# Patient Record
Sex: Female | Born: 1960 | Race: White | Hispanic: No | Marital: Married | Smoking: Former smoker
Health system: Southern US, Community
[De-identification: ages and names within clinical notes are randomized; demographics above are authoritative.]

## PROBLEM LIST (undated history)

## (undated) DIAGNOSIS — R238 Other skin changes: Secondary | ICD-10-CM

## (undated) DIAGNOSIS — I219 Acute myocardial infarction, unspecified: Secondary | ICD-10-CM

## (undated) DIAGNOSIS — E785 Hyperlipidemia, unspecified: Secondary | ICD-10-CM

## (undated) DIAGNOSIS — R609 Edema, unspecified: Secondary | ICD-10-CM

## (undated) DIAGNOSIS — K5792 Diverticulitis of intestine, part unspecified, without perforation or abscess without bleeding: Secondary | ICD-10-CM

## (undated) DIAGNOSIS — R233 Spontaneous ecchymoses: Secondary | ICD-10-CM

## (undated) DIAGNOSIS — I2510 Atherosclerotic heart disease of native coronary artery without angina pectoris: Secondary | ICD-10-CM

## (undated) HISTORY — PX: KNEE SURGERY: SHX244

## (undated) HISTORY — DX: Atherosclerotic heart disease of native coronary artery without angina pectoris: I25.10

## (undated) HISTORY — DX: Spontaneous ecchymoses: R23.3

## (undated) HISTORY — DX: Diverticulitis of intestine, part unspecified, without perforation or abscess without bleeding: K57.92

## (undated) HISTORY — DX: Edema, unspecified: R60.9

## (undated) HISTORY — DX: Acute myocardial infarction, unspecified: I21.9

## (undated) HISTORY — DX: Hyperlipidemia, unspecified: E78.5

## (undated) HISTORY — DX: Other skin changes: R23.8

## (undated) HISTORY — PX: ENDOMETRIAL ABLATION: SHX621

## (undated) HISTORY — PX: TONSILLECTOMY: SUR1361

---

## 1998-02-05 ENCOUNTER — Other Ambulatory Visit: Admission: RE | Admit: 1998-02-05 | Discharge: 1998-02-05 | Payer: Self-pay | Admitting: *Deleted

## 1999-02-09 ENCOUNTER — Other Ambulatory Visit: Admission: RE | Admit: 1999-02-09 | Discharge: 1999-02-09 | Payer: Self-pay | Admitting: *Deleted

## 1999-11-09 HISTORY — PX: TUBAL LIGATION: SHX77

## 2000-02-22 ENCOUNTER — Other Ambulatory Visit: Admission: RE | Admit: 2000-02-22 | Discharge: 2000-02-22 | Payer: Self-pay | Admitting: *Deleted

## 2000-05-13 ENCOUNTER — Encounter (INDEPENDENT_AMBULATORY_CARE_PROVIDER_SITE_OTHER): Payer: Self-pay | Admitting: Specialist

## 2000-05-13 ENCOUNTER — Other Ambulatory Visit: Admission: RE | Admit: 2000-05-13 | Discharge: 2000-05-13 | Payer: Self-pay | Admitting: *Deleted

## 2003-06-15 ENCOUNTER — Encounter: Payer: Self-pay | Admitting: Emergency Medicine

## 2003-06-15 ENCOUNTER — Emergency Department (HOSPITAL_COMMUNITY): Admission: EM | Admit: 2003-06-15 | Discharge: 2003-06-15 | Payer: Self-pay | Admitting: Emergency Medicine

## 2009-06-20 ENCOUNTER — Inpatient Hospital Stay (HOSPITAL_COMMUNITY): Admission: EM | Admit: 2009-06-20 | Discharge: 2009-06-24 | Payer: Self-pay | Admitting: Emergency Medicine

## 2009-06-20 DIAGNOSIS — I219 Acute myocardial infarction, unspecified: Secondary | ICD-10-CM

## 2009-06-20 HISTORY — DX: Acute myocardial infarction, unspecified: I21.9

## 2010-05-26 ENCOUNTER — Emergency Department (HOSPITAL_COMMUNITY): Admission: EM | Admit: 2010-05-26 | Discharge: 2010-05-26 | Payer: Self-pay | Admitting: Emergency Medicine

## 2010-09-07 ENCOUNTER — Ambulatory Visit (HOSPITAL_BASED_OUTPATIENT_CLINIC_OR_DEPARTMENT_OTHER): Admission: RE | Admit: 2010-09-07 | Discharge: 2010-09-07 | Payer: Self-pay | Admitting: Gynecology

## 2010-11-08 HISTORY — PX: CARDIAC CATHETERIZATION: SHX172

## 2011-01-20 LAB — POCT I-STAT 4, (NA,K, GLUC, HGB,HCT)
Glucose, Bld: 98 mg/dL (ref 70–99)
HCT: 41 % (ref 36.0–46.0)
Hemoglobin: 13.9 g/dL (ref 12.0–15.0)
Potassium: 4.4 mEq/L (ref 3.5–5.1)
Sodium: 142 mEq/L (ref 135–145)

## 2011-02-13 LAB — LIPID PANEL
Cholesterol: 179 mg/dL (ref 0–200)
HDL: 25 mg/dL — ABNORMAL LOW (ref >39)
LDL Cholesterol: 124 mg/dL — ABNORMAL HIGH (ref 0–99)
Total CHOL/HDL Ratio: 7.2 RATIO

## 2011-02-13 LAB — CBC
HCT: 36.1 % (ref 36.0–46.0)
HCT: 41.4 % (ref 36.0–46.0)
Hemoglobin: 14.1 g/dL (ref 12.0–15.0)
MCHC: 34 g/dL (ref 30.0–36.0)
MCV: 85.3 fL (ref 78.0–100.0)
Platelets: 235 10*3/uL (ref 150–400)
Platelets: 260 10*3/uL (ref 150–400)
RBC: 4.81 MIL/uL (ref 3.87–5.11)
RDW: 13.9 % (ref 11.5–15.5)
RDW: 14.2 % (ref 11.5–15.5)

## 2011-02-13 LAB — BASIC METABOLIC PANEL
BUN: 10 mg/dL (ref 6–23)
BUN: 10 mg/dL (ref 6–23)
CO2: 26 mEq/L (ref 19–32)
Creatinine, Ser: 0.77 mg/dL (ref 0.4–1.2)
GFR calc Af Amer: >60 mL/min (ref >60)
GFR calc non Af Amer: >60 mL/min (ref >60)
Sodium: 137 mEq/L (ref 135–145)

## 2011-02-14 LAB — DIFFERENTIAL
Basophils Absolute: 0.1 10*3/uL (ref 0.0–0.1)
Eosinophils Absolute: 0.2 10*3/uL (ref 0.0–0.7)
Eosinophils Relative: 2 % (ref 0–5)
Lymphocytes Relative: 26 % (ref 12–46)
Lymphs Abs: 2.7 10*3/uL (ref 0.7–4.0)
Monocytes Absolute: 0.7 10*3/uL (ref 0.1–1.0)

## 2011-02-14 LAB — BASIC METABOLIC PANEL
BUN: 10 mg/dL (ref 6–23)
BUN: 8 mg/dL (ref 6–23)
BUN: 9 mg/dL (ref 6–23)
CO2: 22 mEq/L (ref 19–32)
CO2: 24 mEq/L (ref 19–32)
Chloride: 103 mEq/L (ref 96–112)
Chloride: 108 mEq/L (ref 96–112)
Chloride: 108 mEq/L (ref 96–112)
GFR calc Af Amer: >60 mL/min (ref >60)
GFR calc non Af Amer: >60 mL/min (ref >60)
Glucose, Bld: 102 mg/dL — ABNORMAL HIGH (ref 70–99)
Glucose, Bld: 85 mg/dL (ref 70–99)
Glucose, Bld: 87 mg/dL (ref 70–99)
Potassium: 4 mEq/L (ref 3.5–5.1)
Potassium: 4 mEq/L (ref 3.5–5.1)
Potassium: 4.1 mEq/L (ref 3.5–5.1)

## 2011-02-14 LAB — CBC
HCT: 38.1 % (ref 36.0–46.0)
HCT: 39.7 % (ref 36.0–46.0)
HCT: 42 % (ref 36.0–46.0)
Hemoglobin: 14.5 g/dL (ref 12.0–15.0)
MCHC: 34 g/dL (ref 30.0–36.0)
MCHC: 34.6 g/dL (ref 30.0–36.0)
MCV: 84.6 fL (ref 78.0–100.0)
MCV: 85.1 fL (ref 78.0–100.0)
MCV: 85.2 fL (ref 78.0–100.0)
Platelets: 235 10*3/uL (ref 150–400)
Platelets: 236 10*3/uL (ref 150–400)
RDW: 14 % (ref 11.5–15.5)
RDW: 14.1 % (ref 11.5–15.5)
WBC: 9 10*3/uL (ref 4.0–10.5)

## 2011-02-14 LAB — TROPONIN I
Troponin I: 0.77 ng/mL (ref 0.00–0.06)
Troponin I: 1.11 ng/mL (ref 0.00–0.06)

## 2011-02-14 LAB — POCT CARDIAC MARKERS
CKMB, poc: 4.2 ng/mL (ref 1.0–8.0)
Troponin i, poc: 0.24 ng/mL — ABNORMAL HIGH (ref 0.00–0.09)

## 2011-02-14 LAB — CK TOTAL AND CKMB (NOT AT ARMC): Total CK: 155 U/L (ref 7–177)

## 2011-03-23 NOTE — Cardiovascular Report (Signed)
Sonya Mcdaniel, Sonya Mcdaniel              ACCOUNT NO.:  [id number]   MEDICAL RECORD NO.:  [id number]          PATIENT TYPE:  INP   LOCATION:  2918                         FACILITY:  [facility]   PHYSICIAN:  Corky Crafts, MDDATE OF BIRTH:  1961/02/22   DATE OF PROCEDURE:  DATE OF DISCHARGE:                            CARDIAC CATHETERIZATION   REFERRED BY:  [facility].   PROCEDURE PERFORMED:  Left heart catheterization, left ventriculogram,  coronary angiogram, abdominal aortogram, percutaneous coronary  intervention of the right coronary artery, aspiration, thrombectomy.   OPERATOR:  Corky Crafts, MD   INDICATIONS:  Non-ST segment elevation MI.   PROCEDURE NARRATIVE:  The risks and benefits of cardiac catheterization  were explained to the patient and she was brought urgently to the cath  lab.  She was prepped and draped in the usual sterile fashion.  A 5-  French sheath was placed into the right femoral artery using modified  Seldinger technique.  Left coronary artery angiography was performed  using a JL4.0 catheter.  The catheter was advanced to the vessel ostium  under fluoroscopic guidance.  Digital angiography was performed in  multiple projections using hand injection of contrast.  Right coronary  artery angiography was then performed using a JR4.0 catheter.  Catheter  was advanced to the vessel ostium under fluoroscopic guidance.  Digital  angiography was formed in a similar fashion.  Pigtail catheter was  advanced to the ascending aorta and across the aortic valve.  Under  fluoroscopic guidance, a power injection of contrast was performed in  the RAO projection to image the left ventricle.  The catheter was then  pulled back under continuous hemodynamic pressure monitoring.  The  catheter was withdrawn to the abdominal aorta and power injection of  contrast was performed in the AP projection.  The PCI was then  performed.  See below for details.   The sheath will be removed using  manual compression.   FINDINGS:  The left main was widely patent.   The left circumflex is a large vessel.  There are mild irregularities.  The OM-1 was a small vessel with moderate disease.  The OM-2 is a medium-  sized vessel which was widely patent.  The OM-3 had mild  atherosclerosis.  In between the OM-1 and OM-2, there appeared to be a  hazy 30 to 40% plaque.  There was normal flow through this vessel.   Left anterior descending was a large vessel with an 80% proximal to mid  lesion.  There is mild-to-moderate atherosclerosis in the remainder of  the vessel.  The first diagonal had sequential 80% lesions.   Right coronary artery was a moderate-sized vessel with diffuse  atherosclerosis.  In the mid vessel, there is a 99% lesion with visible  thrombus.  There was TIMI 2 flow through this lesion.  The  posterolateral and posterior descending arteries were both diffusely  diseased.  In the distal right coronary artery, there is a 70% lesion.   Left ventriculogram showed an inferobasal akinesis, otherwise normal  wall motion.  Ejection fraction was 55%.   HEMODYNAMICS:  Left ventricular pressure, 127/1 with an LVEDP of 12  mmHg, aortic pressure 130/75 with a mean aortic pressure of 99 mmHg.   The abdominal aortogram showed no abdominal aortic aneurysm.  There are  bilateral single renal arteries, both of which were widely patent.   PCI NARRATIVE:  A JR4 guiding catheter with side holes was used.  Prowater wire was placed across the lesion in the right coronary artery.  A Fetch catheter was passed to the lesion twice with minimal thrombus  recovered.  A 2.0 x 12 apex balloon was then inflated across the lesion  8 atmospheres for 21 seconds, but thrombus still appeared present.  The  Fetch was then put back for a third pass with successful aspiration of a  large thrombus.  A 2.5 x 12 MiniVision stent was then placed across the  lesion and  deployed at 15 atmospheres for 45 seconds.  The stent balloon  was then advanced to the distal RCA and inflated to 6 atmospheres for 27  seconds.  There was good flow, but there was a linear dissection through  that area.  A 2.5 x 12 Driver stent was then advanced to the distal RCA  and deployed at 12 atmospheres for 31 seconds.  A 2.75 x 8 Voyager Dundee  balloon was then placed within the distal stent inflated to 15  atmospheres for 36 seconds, it was moved proximally to the midvessel  stent and deployed at 16 atmospheres for 28 seconds.  There was TIMI 3  flow.  At the end of the procedure, there was no residual stenosis.   IMPRESSION:  1. Non- ST segment elevation myocardial infarction secondary to 99%      right coronary artery lesion, additional 70% distal right coronary      artery lesion as well, both treated with bare metal stent,      postdilated to 2.8 mm in diameter.  2. Left ventricular ejection fraction of 50 to 55% with inferior      hypokinesis.  3. Left anterior descending with an 80% lesion in the proximal and mid      vessel.  4. No abdominal aortic aneurysm.   RECOMMENDATIONS:  Continue aspirin and Plavix, start beta-blocker and  statin.  We will watch in the step-down unit tonight.  We will plan for  PCI of the LAD in a few days.      Corky Crafts, MD  Electronically Signed     JSV/MEDQ  D:  06/20/2009  T:  06/21/2009  Job:  [id number]

## 2011-03-23 NOTE — H&P (Signed)
[name], Sonya Mcdaniel              ACCOUNT NO.:  [id number]   MEDICAL RECORD NO.:  [id number]          PATIENT TYPE:  INP   LOCATION:  2918                         FACILITY:  [facility]   PHYSICIAN:  Corky Crafts, MDDATE OF BIRTH:  [birth year]   DATE OF ADMISSION:  06/20/2009  DATE OF DISCHARGE:                              HISTORY & PHYSICAL   REFERRING Sonya Mcdaniel:  [facility].   REASON FOR ADMISSION:  Unstable angina.   HISTORY OF PRESENT ILLNESS:  The patient is a 50 year old who has smoked  for a long time who has been having left arm pain, back pain, and chest  pain over the past week.  The longest episode of pain has been about 3  hours.  The pain was worse today so she went to her primary care  physician.  An ECG was done showing some inferior T-wave changes.  She  was sent to the emergency room for further evaluation.  Her initial  point of care cardiac markers were abnormal we are called to further  evaluate.   PAST MEDICAL HISTORY:  Diverticulitis.   PAST SURGICAL HISTORY:  Toe surgery.   ALLERGIES:  NO KNOWN DRUG ALLERGIES.   MEDICATIONS:  1. Ibuprofen p.r.n.  2. Tylenol p.r.n.   SOCIAL HISTORY:  She smokes a pack per day for about 30 years.  She  rarely drinks alcohol.  She does drink coffee every day.  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   FAMILY HISTORY:  No early coronary artery disease.   REVIEW OF SYSTEMS:  Significant for the chest pain as described above.  No dizziness, syncope, nausea, vomiting.  All other systems negative.   PHYSICAL EXAMINATION:  VITAL SIGNS:  Blood pressure 122/82, pulse 74.  GENERAL:  She is awake and alert, in no apparent distress.  NECK:  Right carotid bruit present.  CARDIOVASCULAR:  Regular rate and rhythm.  S1 and S2.  LUNGS:  Clear to auscultation bilaterally.  ABDOMEN:  Soft, nontender, nondistended.  EXTREMITIES:  Posterior tibial pulses 2+ bilaterally.   LABORATORY DATA:  Lab work  shows a troponin of 0.24, potassium 4.1,  troponin 0.7, hemoglobin 14.5.  ECG shows T-wave inversions in the  inferior lead with normal sinus rhythm on the initial ECG.  T-wave  inversion have improved on the more recent ECG, poor R-wave progression.   ASSESSMENT:  A 50 year old with unstable angina/acute coronary syndrome.   PLAN:  1. Risks and benefits of the cath were explained to the patient.  She      and her family understand.  We will proceed to catheterization.  2. She needs to stop smoking.  3. She will eventually need a beta-blocker and statin.  Further plans      depending on the results of the cath.      Corky Crafts, MD  Electronically Signed     JSV/MEDQ  D:  06/20/2009  T:  06/21/2009  Job:  [id number]

## 2011-03-23 NOTE — Cardiovascular Report (Signed)
NAMEYSIDRA, Sonya Mcdaniel              ACCOUNT NO.:  [id number]   MEDICAL RECORD NO.:  [id number]          PATIENT TYPE:  INP   LOCATION:  2506                         FACILITY:  [facility]   PHYSICIAN:  Corky Crafts, MDDATE OF BIRTH:  Mar 16, 1961   DATE OF PROCEDURE:  DATE OF DISCHARGE:                            CARDIAC CATHETERIZATION   REFERRING PHYSICIAN:  [facility].   PROCEDURES PERFORMED:  Percutaneous coronary intervention to the left  anterior descending, coronary angiogram, intracoronary vascular  ultrasound of the left anterior descending.   OPERATOR:  Corky Crafts, MD   INDICATIONS:  Unstable angina with recent myocardial infarction.   PROCEDURE NOTE:  The risks and benefits of cardiac catheterization were  explained to the patient and informed consent was obtained.  She was  brought to the cath lab.  She was prepped and draped in the usual  sterile fashion.  Her right groin was infiltrated with 1% lidocaine.  A  6-French sheath was placed into the right femoral artery using the  modified Seldinger technique.  Right coronary artery angiography was  performed using a 4-French JR-4 catheter.  The catheter was advanced to  the vessel ostium under fluoroscopic guidance.  Digital angiography was  performed using hand injection of contrast.  A CLS 3.0 guiding catheter  was then advanced to the ostium of the left main under fluoroscopic  guidance.  There is catheter damping noted.  The catheter was then  switched out for a XB 3.5 with side holes.  The lesion in the LAD was  then intervened upon.  See below for details.  The sheath was removed,  then an Angio-Seal was deployed for hemostasis.  Angiomax was used  throughout the case for anticoagulation.  Intracoronary nitroglycerin  was given several times during the case.  Interventional narrative XB  3.5 with side holes was used.  A Prowater wire was placed across the  lesion in the LAD.  A 2.5 x  12 apex was placed across the lesion and  inflated to 8 atmospheres for 30 seconds.  A 3.0 x 15 Endeavor stent was  then deployed across the lesion at 14 atmospheres.  The stent was then  postdilated with a 3.5 x 12 mm Voyager Cowgill balloon, inflated in the  distal portion to 16 atmospheres, and in the more proximal portion to 15  atmospheres and IVUS of the LAD stent was then performed, which showed a  well-opposed stent.  There is no residual stenosis.  There is TIMI III  flow.   IMPRESSION:  1. Patent right coronary artery stents, which were placed previously.  2. Successful drug-eluting stent placement to the left anterior      descending.  3. The circumflex was also reevaluated and did not have a significant      stenosis.  The hazy area which was seen on the prior angiogram      appeared to be improved when imaged with the guiding catheter.  4. There was still residual disease in a small diagonal vessel, but      this appeared to be too small even  to stent.   RECOMMENDATIONS:  Continue aspirin and Plavix indefinitely along with  secondary prevention including smoking cessation.      Corky Crafts, MD  Electronically Signed     JSV/MEDQ  D:  06/23/2009  T:  06/24/2009  Job:  [id number]

## 2011-03-23 NOTE — Discharge Summary (Signed)
Sonya Mcdaniel, Sonya Mcdaniel              ACCOUNT NO.:  [id number]   MEDICAL RECORD NO.:  [id number]          PATIENT TYPE:  INP   LOCATION:  2506                         FACILITY:  [facility]   PHYSICIAN:  Corky Crafts, MDDATE OF BIRTH:  1961-10-16   DATE OF ADMISSION:  06/20/2009  DATE OF DISCHARGE:  06/24/2009                               DISCHARGE SUMMARY   DISCHARGE DIAGNOSES:  1. Myocardial infarction.  2. Coronary artery disease.  3. Dyslipidemia.  4. Tobacco abuse.   PROCEDURE PERFORMED:  1. On June 20, 2009, cardiac catheterization with right coronary      artery stent placement x2 with bare-metal stent.  2. On June 23, 2009, cardiac catheterization with drug-eluting stent      to the left anterior descending.   HOSPITAL COURSE:  The patient was admitted on June 20, 2009, with  unstable angina and non-ST elevation MI.  She was taken to the cath lab  and had aspiration thrombectomy and revascularization of her right  coronary artery.  There was also LAD disease noted, which was fixed on  Monday June 23, 2009.  She tolerated both procedures very well.  She  did not have any groin issues.  She did not have any significant chest  pain.  She was able to refrain from any tobacco while in the hospital.  She did have a low HDL of 25 and high LDL of 308.  Triglycerides were  150 and total cholesterol 179.   DISCHARGE MEDICATIONS:  1. Aspirin 325 mg a day.  2. Plavix 75 mg daily.  3. Metoprolol extended release 25 mg daily.  4. Simvastatin 40 mg daily.   ACE inhibitor was not used because of borderline blood pressures.   DISCHARGE INSTRUCTIONS:  She is instructed not to lift more than 10  pounds for about a week.  She is not to drive for a few days.  She is  instructed to stop smoking.  Low salt, heart healthy diet.   FOLLOWUP:  Follow up with Dr. Eldridge Dace on July 02, 2009, at 2:30 p.m.      Corky Crafts, MD  Electronically Signed     JSV/MEDQ  D:   06/24/2009  T:  06/24/2009  Job:  [id number]

## 2013-01-05 ENCOUNTER — Other Ambulatory Visit: Payer: Self-pay | Admitting: Family Medicine

## 2013-01-05 DIAGNOSIS — Z1231 Encounter for screening mammogram for malignant neoplasm of breast: Secondary | ICD-10-CM

## 2013-01-29 ENCOUNTER — Ambulatory Visit
Admission: RE | Admit: 2013-01-29 | Discharge: 2013-01-29 | Disposition: A | Discharge status: Home / Self Care | Payer: No Typology Code available for payment source | Source: Ambulatory Visit | Attending: Family Medicine | Admitting: Family Medicine

## 2013-01-29 DIAGNOSIS — Z1231 Encounter for screening mammogram for malignant neoplasm of breast: Secondary | ICD-10-CM

## 2013-07-20 HISTORY — PX: HAMMER TOE SURGERY: SHX385

## 2013-08-16 ENCOUNTER — Encounter: Payer: Self-pay | Admitting: Podiatry

## 2013-08-16 ENCOUNTER — Ambulatory Visit (INDEPENDENT_AMBULATORY_CARE_PROVIDER_SITE_OTHER): Payer: No Typology Code available for payment source | Admitting: Podiatry

## 2013-08-16 ENCOUNTER — Ambulatory Visit (INDEPENDENT_AMBULATORY_CARE_PROVIDER_SITE_OTHER): Payer: No Typology Code available for payment source

## 2013-08-16 VITALS — BP 137/88 | HR 77 | Resp 16 | Ht 67.0 in | Wt 160.0 lb

## 2013-08-16 DIAGNOSIS — Z9889 Other specified postprocedural states: Secondary | ICD-10-CM

## 2013-08-16 NOTE — Progress Notes (Signed)
  Subjective:    Patient ID: Sonya Mcdaniel, female    DOB: 1961-10-16, 52 y.o.   MRN: [id number]  HPI    Review of Systems     Objective:   Physical Exam        Assessment & Plan:

## 2013-08-16 NOTE — Patient Instructions (Signed)
Continue wrapping toes.  May wear shoes.

## 2013-08-16 NOTE — Progress Notes (Signed)
Subjective:     Patient ID: Sonya Mcdaniel, female   DOB: 11-11-1960, 52 y.o.   MRN: [id number]  HPI French Ana presents today for followup of bilateral surgical foot date of surgery was 07/20/2013. She states that they're doing quite well.  Review of Systems Unremarkable    Objective:   Physical Exam Mildly edematous toes 2 through 4 bilateral. Otherwise appear to be healing quite nicely.    Assessment:     Well-healing surgical foot bilateral    Plan:     Followup with me in one month.

## 2013-09-18 ENCOUNTER — Encounter: Payer: No Typology Code available for payment source | Admitting: Podiatry

## 2013-09-20 ENCOUNTER — Encounter: Payer: No Typology Code available for payment source | Admitting: Podiatry

## 2013-10-02 ENCOUNTER — Encounter: Payer: Self-pay | Admitting: Podiatry

## 2013-10-02 ENCOUNTER — Ambulatory Visit (INDEPENDENT_AMBULATORY_CARE_PROVIDER_SITE_OTHER): Payer: No Typology Code available for payment source | Admitting: Podiatry

## 2013-10-02 ENCOUNTER — Ambulatory Visit (INDEPENDENT_AMBULATORY_CARE_PROVIDER_SITE_OTHER): Payer: No Typology Code available for payment source

## 2013-10-02 VITALS — BP 140/83 | HR 78 | Resp 16

## 2013-10-02 DIAGNOSIS — Z9889 Other specified postprocedural states: Secondary | ICD-10-CM

## 2013-10-02 NOTE — Progress Notes (Signed)
Sonya Mcdaniel presents today for her final postop visit regarding hammertoe repair lesser digits bilateral foot. She had this procedure performed in September. She denies fever chills nausea vomiting muscle aches or pains. And is back to her regular shoes.  Objective: Vital signs are stable she is alert and oriented x3. Pulses are strongly palpable bilateral. Much decrease in edema no erythema saline is drainage or odor to the toes. Radiographic evaluation Mr. is well-healed surgical toes bilateral.  Assessment: Well-healing surgical toes bilateral.  Plan: Get back to her regular routine will followup with me only on an as-needed basis.

## 2013-10-23 ENCOUNTER — Ambulatory Visit: Payer: No Typology Code available for payment source | Admitting: Interventional Cardiology

## 2013-11-27 ENCOUNTER — Encounter: Payer: Self-pay | Admitting: Cardiology

## 2013-11-27 DIAGNOSIS — I252 Old myocardial infarction: Secondary | ICD-10-CM

## 2013-11-27 DIAGNOSIS — I2510 Atherosclerotic heart disease of native coronary artery without angina pectoris: Secondary | ICD-10-CM

## 2013-11-27 DIAGNOSIS — E782 Mixed hyperlipidemia: Secondary | ICD-10-CM | POA: Insufficient documentation

## 2013-11-29 ENCOUNTER — Ambulatory Visit (INDEPENDENT_AMBULATORY_CARE_PROVIDER_SITE_OTHER): Payer: BC Managed Care – PPO | Admitting: Interventional Cardiology

## 2013-11-29 ENCOUNTER — Encounter: Payer: Self-pay | Admitting: Interventional Cardiology

## 2013-11-29 VITALS — BP 124/76 | HR 72 | Ht 67.0 in | Wt 173.0 lb

## 2013-11-29 DIAGNOSIS — I252 Old myocardial infarction: Secondary | ICD-10-CM

## 2013-11-29 DIAGNOSIS — E782 Mixed hyperlipidemia: Secondary | ICD-10-CM

## 2013-11-29 DIAGNOSIS — I2510 Atherosclerotic heart disease of native coronary artery without angina pectoris: Secondary | ICD-10-CM

## 2013-11-29 MED ORDER — CLOPIDOGREL BISULFATE 75 MG PO TABS: 75.0000 mg | ORAL_TABLET | Freq: Every day | ORAL | Status: DC

## 2013-11-29 MED ORDER — METOPROLOL SUCCINATE ER 25 MG PO TB24: 25.0000 mg | ORAL_TABLET | Freq: Every day | ORAL | Status: DC

## 2013-11-29 MED ORDER — ATORVASTATIN CALCIUM 40 MG PO TABS: 40.0000 mg | ORAL_TABLET | Freq: Every day | ORAL | Status: DC

## 2013-11-29 NOTE — Patient Instructions (Signed)
Your physician wants you to follow-up in: 1 year with Dr. Eldridge DaceVaranasi. You will receive a reminder letter in the mail two months in advance. If you don't receive a letter, please call our office to schedule the follow-up appointment.  Your physician recommends that you return for a FASTING lipid profile: 12/05/13.

## 2013-11-29 NOTE — Progress Notes (Signed)
Patient ID: Sonya Mcdaniel, female   DOB: 10/07/1961, 53 y.o.   MRN: [id number]    [address] [place], [place]  [address] Phone: [phone] Fax:  [phone]  Date:  11/29/2013   ID:  [name], DOB 11/17/1960, MRN [id number]  PCP:  Joycelyn Rua, MD      History of Present Illness: Sonya Mcdaniel is a 53 y.o. female with CAD. She has a BMS in RCA and DES in LAD. Had uterine procedure to help with bleeding. Mother had a stent recently. Daughter was diagnosed with Hodgkins disease. Nephew was diagnosed with the same. CAD/ASCVD:  She has been exercising regularly several times a week at home. SHe does Zumba. No chest pain with that activity. Denies : Chest pain.  Dizziness.  Dyspnea on exertion.   She had foot surgery.    Wt Readings from Last 3 Encounters:  11/29/13 173 lb (78.472 kg)  08/16/13 160 lb (72.576 kg)     Past Medical History  Diagnosis Date  . Swelling     FEET  . Heart attack 08.13.2010  . Bruises easily   . Hyperlipidemia   . Coronary artery disease   . Diverticulitis     Current Outpatient Prescriptions  Medication Sig Dispense Refill  . aspirin 325 MG tablet Take 325 mg by mouth daily.      Marland Kitchen atorvastatin (LIPITOR) 40 MG tablet Take 40 mg by mouth daily.      . Cetirizine HCl (ZYRTEC ALLERGY PO) Take by mouth daily.      . clopidogrel (PLAVIX) 75 MG tablet Take 75 mg by mouth daily.      . Magnesium 250 MG TABS Take 1,000 mg by mouth daily.      . metoprolol succinate (TOPROL-XL) 25 MG 24 hr tablet Take 25 mg by mouth daily.      . niacin (NIASPAN) 500 MG CR tablet Take 1,000 mg by mouth at bedtime.       . nitroGLYCERIN (NITROSTAT) 0.4 MG SL tablet Place 0.4 mg under the tongue every 5 (five) minutes as needed for chest pain.       No current facility-administered medications for this visit.    Allergies:    Allergies  Allergen Reactions  . Codeine Other (See Comments)    SINUS   . Tramadol Nausea And Vomiting     Social History:  The patient  reports that she has quit smoking. Her smoking use included Cigarettes. She smoked 0.25 packs per day. She has never used smokeless tobacco. She reports that she drinks alcohol. She reports that she does not use illicit drugs.   Family History:  The patient's family history is not on file.   ROS:  Please see the history of present illness.  No nausea, vomiting.  No fevers, chills.  No focal weakness.  No dysuria. Flushing with Niaspan.   All other systems reviewed and negative.   PHYSICAL EXAM: VS:  BP 124/76  Pulse 72  Ht 5\' 7"  (1.702 m)  Wt 173 lb (78.472 kg)  BMI 27.09 kg/m2 Well nourished, well developed, in no acute distress HEENT: normal Neck: no JVD, no carotid bruits Cardiac:  normal S1, S2; RRR;  Lungs:  clear to auscultation bilaterally, no wheezing, rhonchi or rales Abd: soft, nontender, no hepatomegaly Ext: no edema Skin: warm and dry Neuro:   no focal abnormalities noted  EKG:  NSR, no St segment changes     ASSESSMENT AND PLAN:  Coronary atherosclerosis  of native coronary artery  Refill Plavix Tablet, 75 MG, 1 tablet, Orally, Once a day, 90 days, 90, Refills 3 Refill Metoprolol Succinate Tablet Extended Release 24 Hour, 25 MG, 1 tablet, Orally, Once a day, 90 days, 90, Refills 3 Decrease Aspirin 325 mg tablet, 325 mg, half tab, orally, daily Refill Nitroglycerin 0.4 mg tablet, 0.4 mg, 1 tablet as directed, SL, as directed prn chest pain, 30 days, 25, Refills 3 Diagnostic Imaging:EKG Harward,Amy 10/24/2012 04:03:16 PM > Karinne Schmader,JAY 10/24/2012 05:56:41 PM > normal  No angina. OK to decrease aspirin dose if bleeding comes back.    2. History of MI (myocardial infarction)  No CHF.    3. Combined hyperlipidemia  Continue Atorvastatin Calcium Tablet, 40 MG, 1 tablet, Orally, Once a day, 90, Refills 3 LDL controlled. HDL low. Lipids reviewed from 08/2012.   Now off of Niaspan.  We'll see how lipids are doing. If they're well  controlled, can staff Niaspan. If HDL still low and triglycerides have increased, would have to come up with another therapy. She stopped the Niaspan due to Flushing. Preventive Medicine  Adult topics discussed:  Exercise: Continue present exercise program.      Signed, Fredric MareJay S. Tenelle Andreason, MD, [facility] 11/29/2013 4:41 PM

## 2013-12-02 ENCOUNTER — Other Ambulatory Visit: Payer: Self-pay | Admitting: Interventional Cardiology

## 2013-12-03 ENCOUNTER — Other Ambulatory Visit: Payer: Self-pay | Admitting: Interventional Cardiology

## 2013-12-05 ENCOUNTER — Other Ambulatory Visit (INDEPENDENT_AMBULATORY_CARE_PROVIDER_SITE_OTHER): Payer: BC Managed Care – PPO

## 2013-12-05 DIAGNOSIS — E782 Mixed hyperlipidemia: Secondary | ICD-10-CM

## 2013-12-05 LAB — LIPID PANEL
CHOL/HDL RATIO: 4
CHOLESTEROL: 109 mg/dL (ref 0–200)
HDL: 31.1 mg/dL — ABNORMAL LOW (ref >39.00)
LDL Cholesterol: 45 mg/dL (ref 0–99)
TRIGLYCERIDES: 166 mg/dL — AB (ref 0.0–149.0)
VLDL: 33.2 mg/dL (ref 0.0–40.0)

## 2013-12-05 LAB — ALT: ALT: 22 U/L (ref 0–35)

## 2013-12-05 NOTE — Progress Notes (Signed)
Quick Note:  Preliminary report reviewed by triage nurse and sent to MD desk. ______ 

## 2013-12-07 ENCOUNTER — Other Ambulatory Visit: Payer: Self-pay | Admitting: Interventional Cardiology

## 2013-12-14 ENCOUNTER — Telehealth: Payer: Self-pay | Admitting: Cardiology

## 2013-12-14 DIAGNOSIS — E782 Mixed hyperlipidemia: Secondary | ICD-10-CM

## 2013-12-14 NOTE — Telephone Encounter (Signed)
Message copied by Theda SersSTEGALL, Kyrstyn Greear H on Fri Dec 14, 2013  3:53 PM ------      Message from: SMART, Gaspar SkeetersJEREMY G      Created: Mon Dec 10, 2013 10:44 AM       LDL goal < 70, non-HDL goal < 100      Meds:  Lipitor 40 mg qd.      Patient stopped niaspan 1000 mg on her own since last panel.      LDL and non-HDL both at goal despite chronic low HDL.      ALT normal.      Plan:      1.  No change in meds at this time.      2.  Will check an NMR in 6 months to assess LDL-P given h/o low HDL and CAD at young age.  If LDL-P elevated, may consider titrating lipitor to 80 mg at that time.      3.  Check an NMR LipoProfile and Hepatic panel in 6 months.      Please notify patient, and set up labs. Thanks. ------

## 2013-12-14 NOTE — Telephone Encounter (Signed)
Pt notified and labs ordered.

## 2014-05-24 ENCOUNTER — Other Ambulatory Visit (INDEPENDENT_AMBULATORY_CARE_PROVIDER_SITE_OTHER): Payer: BC Managed Care – PPO

## 2014-05-24 DIAGNOSIS — E782 Mixed hyperlipidemia: Secondary | ICD-10-CM

## 2014-05-24 LAB — HEPATIC FUNCTION PANEL
ALT: 27 U/L (ref 0–35)
AST: 20 U/L (ref 0–37)
Albumin: 3.8 g/dL (ref 3.5–5.2)
Alkaline Phosphatase: 56 U/L (ref 39–117)
BILIRUBIN DIRECT: 0 mg/dL (ref 0.0–0.3)
BILIRUBIN TOTAL: 0.7 mg/dL (ref 0.2–1.2)
Total Protein: 7 g/dL (ref 6.0–8.3)

## 2014-05-25 LAB — NMR LIPOPROFILE WITH LIPIDS
CHOLESTEROL, TOTAL: 121 mg/dL (ref <200)
HDL PARTICLE NUMBER: 24.4 umol/L — AB (ref >=30.5)
HDL Size: 8.8 nm — ABNORMAL LOW (ref >=9.2)
HDL-C: 34 mg/dL — ABNORMAL LOW (ref >=40)
LDL (calc): 70 mg/dL (ref <100)
LDL Particle Number: 1133 nmol/L — ABNORMAL HIGH (ref <1000)
LDL Size: 19.9 nm — ABNORMAL LOW (ref >20.5)
LP-IR Score: 65 — ABNORMAL HIGH (ref <=45)
Large HDL-P: <1.3 umol/L — ABNORMAL LOW (ref >=4.8)
Large VLDL-P: 2.1 nmol/L (ref <=2.7)
SMALL LDL PARTICLE NUMBER: 806 nmol/L — AB (ref <=527)
Triglycerides: 87 mg/dL (ref <150)
VLDL Size: 53.5 nm — ABNORMAL HIGH (ref <=46.6)

## 2014-05-31 ENCOUNTER — Telehealth: Payer: Self-pay | Admitting: Cardiology

## 2014-05-31 DIAGNOSIS — E782 Mixed hyperlipidemia: Secondary | ICD-10-CM

## 2014-05-31 MED ORDER — ATORVASTATIN CALCIUM 80 MG PO TABS: ORAL_TABLET | ORAL | Status: AC

## 2014-05-31 MED ORDER — METOPROLOL SUCCINATE ER 25 MG PO TB24: 25.0000 mg | ORAL_TABLET | Freq: Every day | ORAL | Status: DC

## 2014-05-31 MED ORDER — CLOPIDOGREL BISULFATE 75 MG PO TABS: 75.0000 mg | ORAL_TABLET | Freq: Every day | ORAL | Status: DC

## 2014-05-31 NOTE — Telephone Encounter (Signed)
Pt notified, labs ordered and meds updated.

## 2014-05-31 NOTE — Telephone Encounter (Signed)
Message copied by Theda SersSTEGALL, Shawnie Nicole H on Fri May 31, 2014  2:29 PM ------      Message from: SMART, Riki RuskJEREMY G      Created: Mon May 27, 2014  2:20 PM       LDL goal < 70, non-HDL goal < 100; LDL-P number goal < 1000      Meds:  Lipitor 40 mg qd.      H/o CAD at young age, so an NMR was drawn.  LDL-P number goal < 1000 - it is 1133 today.      Patient stopped niaspan 1000 mg on her own.      LDL and non-HDL both at goal despite chronic low HDL, however LDL-P number is elevated.      LFTs normal      Plan:      1.  Increase lipitor to 80 mg qd.      2.  Continue all other meds.      3.  Recheck NMR LipoProfile and hepatic panel in 6 months.      Please notify patient, update meds, and set up labs. Thanks. ------

## 2014-12-02 ENCOUNTER — Other Ambulatory Visit: Payer: BC Managed Care – PPO

## 2014-12-04 ENCOUNTER — Other Ambulatory Visit (INDEPENDENT_AMBULATORY_CARE_PROVIDER_SITE_OTHER): Payer: BLUE CROSS/BLUE SHIELD | Admitting: *Deleted

## 2014-12-04 DIAGNOSIS — E782 Mixed hyperlipidemia: Secondary | ICD-10-CM | POA: Diagnosis not present

## 2014-12-04 LAB — HEPATIC FUNCTION PANEL
ALT: 28 U/L (ref 0–35)
AST: 19 U/L (ref 0–37)
Albumin: 4.1 g/dL (ref 3.5–5.2)
Alkaline Phosphatase: 70 U/L (ref 39–117)
BILIRUBIN TOTAL: 0.7 mg/dL (ref 0.2–1.2)
Bilirubin, Direct: 0.1 mg/dL (ref 0.0–0.3)
Total Protein: 7.2 g/dL (ref 6.0–8.3)

## 2014-12-04 NOTE — Addendum Note (Signed)
Addended by: Ronnette Rump K on: 10/15/2015 07:40 AM   Modules accepted: Orders  

## 2014-12-05 ENCOUNTER — Encounter: Payer: Self-pay | Admitting: Interventional Cardiology

## 2014-12-05 ENCOUNTER — Ambulatory Visit (INDEPENDENT_AMBULATORY_CARE_PROVIDER_SITE_OTHER): Payer: BLUE CROSS/BLUE SHIELD | Admitting: Interventional Cardiology

## 2014-12-05 VITALS — BP 114/72 | HR 72 | Ht 67.0 in | Wt 162.8 lb

## 2014-12-05 DIAGNOSIS — E782 Mixed hyperlipidemia: Secondary | ICD-10-CM

## 2014-12-05 DIAGNOSIS — I252 Old myocardial infarction: Secondary | ICD-10-CM

## 2014-12-05 DIAGNOSIS — I2510 Atherosclerotic heart disease of native coronary artery without angina pectoris: Secondary | ICD-10-CM

## 2014-12-05 NOTE — Progress Notes (Signed)
Patient ID: Sonya Mcdaniel, female   DOB: [birth year], 54 y.o.   MRN: [id number] Patient ID: Sonya Mcdaniel, female   DOB: [birth year], 54 y.o.   MRN: [id number]    [address] [place], [place]  [address] Phone: [phone] Fax:  [phone]  Date:  12/05/2014   ID:  Sonya Mcdaniel, DOB [birth year], MRN [id number]  PCP:  Joycelyn RuaMEYERS, STEPHEN, MD      History of Present Illness: Sonya Grandchildracye Caldeira is a 54 y.o. female with CAD. She has a BMS in RCA and DES in LAD in 2010.  Had uterine procedure to help with bleeding several years ago. Mother had a stent in the past.  Daughter was diagnosed with Hodgkins disease. She is close to being cancer free.  One scan pending. CAD/ASCVD:  She has been exercising regularly several times a week at home. No chest pain with that activity. Denies : Chest pain.  Dizziness.  Dyspnea on exertion.   She had foot surgery, toes are straight, but there is some edema.     Wt Readings from Last 3 Encounters:  12/05/14 162 lb 12.8 oz (73.846 kg)  11/29/13 173 lb (78.472 kg)  08/16/13 160 lb (72.576 kg)     Past Medical History  Diagnosis Date  . Swelling     FEET  . Heart attack 08.13.2010  . Bruises easily   . Hyperlipidemia   . Coronary artery disease   . Diverticulitis     Current Outpatient Prescriptions  Medication Sig Dispense Refill  . aspirin 325 MG tablet Take 325 mg by mouth daily.    Marland Kitchen. atorvastatin (LIPITOR) 80 MG tablet 1 tablet daily (Patient taking differently: Take 80 mg by mouth daily at 6 PM. 1 tablet daily) 30 tablet 6  . clopidogrel (PLAVIX) 75 MG tablet Take 1 tablet (75 mg total) by mouth daily. 30 tablet 6  . loratadine (CLARITIN) 10 MG tablet Take 10 mg by mouth daily.    . metoprolol succinate (TOPROL-XL) 25 MG 24 hr tablet Take 1 tablet (25 mg total) by mouth daily. 30 tablet 6  . nitroGLYCERIN (NITROSTAT) 0.4 MG SL tablet Place 0.4 mg under the tongue every 5 (five) minutes as needed for chest pain.     No current  facility-administered medications for this visit.    Allergies:    Allergies  Allergen Reactions  . Codeine Other (See Comments)    SINUS   . Tramadol Nausea And Vomiting    Social History:  The patient  reports that she has quit smoking. Her smoking use included Cigarettes. She smoked 0.25 packs per day. She has never used smokeless tobacco. She reports that she drinks alcohol. She reports that she does not use illicit drugs.   Family History:  The patient's family history includes Heart disease in her mother.   ROS:  Please see the history of present illness.  No nausea, vomiting.  No fevers, chills.  No focal weakness.  No dysuria. Flushing with Niaspan.   All other systems reviewed and negative.   PHYSICAL EXAM: VS:  BP 114/72 mmHg  Pulse 72  Ht 5\' 7"  (1.702 m)  Wt 162 lb 12.8 oz (73.846 kg)  BMI 25.49 kg/m2 Well nourished, well developed, in no acute distress HEENT: normal Neck: no JVD, no carotid bruits Cardiac:  normal S1, S2; RRR;  Lungs:  clear to auscultation bilaterally, no wheezing, rhonchi or rales Abd: soft, nontender, no hepatomegaly Ext: no edema Skin: warm and dry Neuro:  no focal abnormalities noted Psych: normal affect  EKG:  NSR, no St segment changes     ASSESSMENT AND PLAN:  Coronary atherosclerosis of native coronary artery  Refill Plavix Tablet, 75 MG, 1 tablet, Orally, Once a day, 90 days, 90, Refills 3 Refill Metoprolol Succinate Tablet Extended Release 24 Hour, 25 MG, 1 tablet, Orally, Once a day, 90 days, 90, Refills 3 Decrease Aspirin 325 mg tablet, 325 mg, half tab, orally, daily if there are bleeding issues Refill Nitroglycerin 0.4 mg tablet, 0.4 mg, 1 tablet as directed, SL, as directed prn chest pain, 30 days, 25, Refills 3  No angina. OK to decrease aspirin dose if bleeding comes back.    2. History of MI (myocardial infarction)  No CHF.    3. Combined hyperlipidemia  Continue Atorvastatin Calcium Tablet, 80 MG, 1 tablet, Orally,  Once a day, 90, Refills 3 LDL controlled. HDL low.  Particle number was elevated in July 2015. Await result from 1/16.  Now off of Niaspan.  We'll see how lipids are doing. If HDL still low and triglycerides have increased, would have to come up with another therapy. She stopped the Niaspan due to Flushing. Preventive Medicine  Adult topics discussed:  Exercise: Continue present exercise program.      Signed, Fredric Mare, MD, [facility] 12/05/2014 9:56 AM

## 2014-12-05 NOTE — Patient Instructions (Signed)
Your physician recommends that you continue on your current medications as directed. Please refer to the Current Medication list given to you today.  Your physician wants you to follow-up in: 1 year with Dr. Varanasi. You will receive a reminder letter in the mail two months in advance. If you don't receive a letter, please call our office to schedule the follow-up appointment.  

## 2014-12-06 LAB — NMR LIPOPROFILE WITH LIPIDS
CHOLESTEROL, TOTAL: 131 mg/dL (ref 100–199)
HDL Particle Number: 23.3 umol/L — ABNORMAL LOW (ref >=30.5)
HDL Size: 8.8 nm — ABNORMAL LOW (ref >=9.2)
HDL-C: 31 mg/dL — ABNORMAL LOW (ref >39)
LARGE VLDL-P: 5.8 nmol/L — AB (ref <=2.7)
LDL (calc): 66 mg/dL (ref 0–99)
LDL Particle Number: 928 nmol/L (ref <1000)
LDL Size: 20.4 nm (ref >=20.8)
LP-IR Score: 65 — ABNORMAL HIGH (ref <=45)
Large HDL-P: 3.8 umol/L — ABNORMAL LOW (ref >=4.8)
Small LDL Particle Number: 428 nmol/L (ref <=527)
Triglycerides: 171 mg/dL — ABNORMAL HIGH (ref 0–149)
VLDL Size: 49.8 nm — ABNORMAL HIGH (ref <=46.6)

## 2014-12-11 ENCOUNTER — Other Ambulatory Visit: Payer: Self-pay | Admitting: *Deleted

## 2014-12-11 DIAGNOSIS — I2510 Atherosclerotic heart disease of native coronary artery without angina pectoris: Secondary | ICD-10-CM

## 2015-03-10 ENCOUNTER — Other Ambulatory Visit: Payer: Self-pay | Admitting: Interventional Cardiology

## 2015-07-10 ENCOUNTER — Other Ambulatory Visit: Payer: Self-pay

## 2015-07-10 MED ORDER — METOPROLOL SUCCINATE ER 25 MG PO TB24: 25.0000 mg | ORAL_TABLET | Freq: Every day | ORAL | Status: DC

## 2015-07-10 NOTE — Telephone Encounter (Signed)
Corky Crafts, MD at 12/05/2014 9:37 AM   metoprolol succinate (TOPROL-XL) 25 MG 24 hr tablet Take 1 tablet (25 mg total) by mouth daily          Patient Instructions     Your physician recommends that you continue on your current medications as directed. Please refer to the Current Medication list given to you today.

## 2015-12-12 ENCOUNTER — Other Ambulatory Visit: Payer: Self-pay | Admitting: Interventional Cardiology

## 2015-12-14 ENCOUNTER — Other Ambulatory Visit: Payer: Self-pay | Admitting: Interventional Cardiology

## 2016-03-16 ENCOUNTER — Other Ambulatory Visit: Payer: Self-pay | Admitting: Family Medicine

## 2016-03-16 DIAGNOSIS — Z1231 Encounter for screening mammogram for malignant neoplasm of breast: Secondary | ICD-10-CM

## 2016-03-30 ENCOUNTER — Ambulatory Visit
Admission: RE | Admit: 2016-03-30 | Discharge: 2016-03-30 | Disposition: A | Discharge status: Home / Self Care | Payer: BLUE CROSS/BLUE SHIELD | Source: Ambulatory Visit | Attending: Family Medicine | Admitting: Family Medicine

## 2016-03-30 DIAGNOSIS — Z1231 Encounter for screening mammogram for malignant neoplasm of breast: Secondary | ICD-10-CM

## 2016-07-20 DIAGNOSIS — M7701 Medial epicondylitis, right elbow: Secondary | ICD-10-CM | POA: Diagnosis not present

## 2016-07-20 DIAGNOSIS — M7702 Medial epicondylitis, left elbow: Secondary | ICD-10-CM | POA: Diagnosis not present

## 2016-07-20 DIAGNOSIS — M79642 Pain in left hand: Secondary | ICD-10-CM | POA: Diagnosis not present

## 2016-07-20 DIAGNOSIS — M79641 Pain in right hand: Secondary | ICD-10-CM | POA: Diagnosis not present

## 2016-08-31 DIAGNOSIS — Z23 Encounter for immunization: Secondary | ICD-10-CM | POA: Diagnosis not present

## 2017-09-09 IMAGING — MG MM DIGITAL SCREENING BILAT
4 series · 4 of 4 positions shown · non-contrast
Comparison: Previous exam(s).

CLINICAL DATA: Screening.

EXAM:
DIGITAL SCREENING BILATERAL MAMMOGRAM WITH CAD

[R MLO]
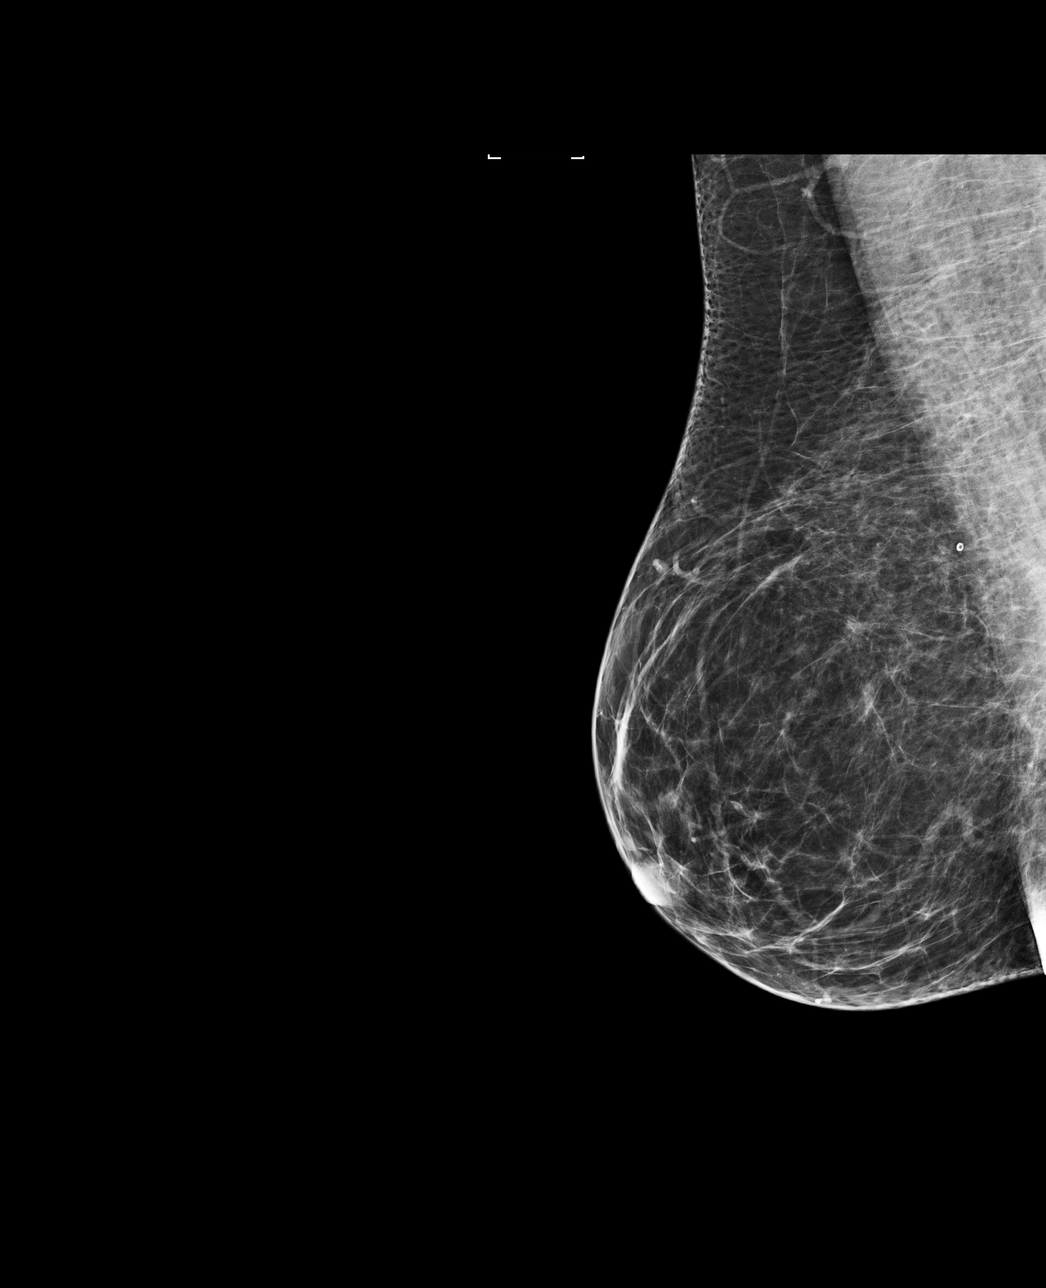

[L MLO]
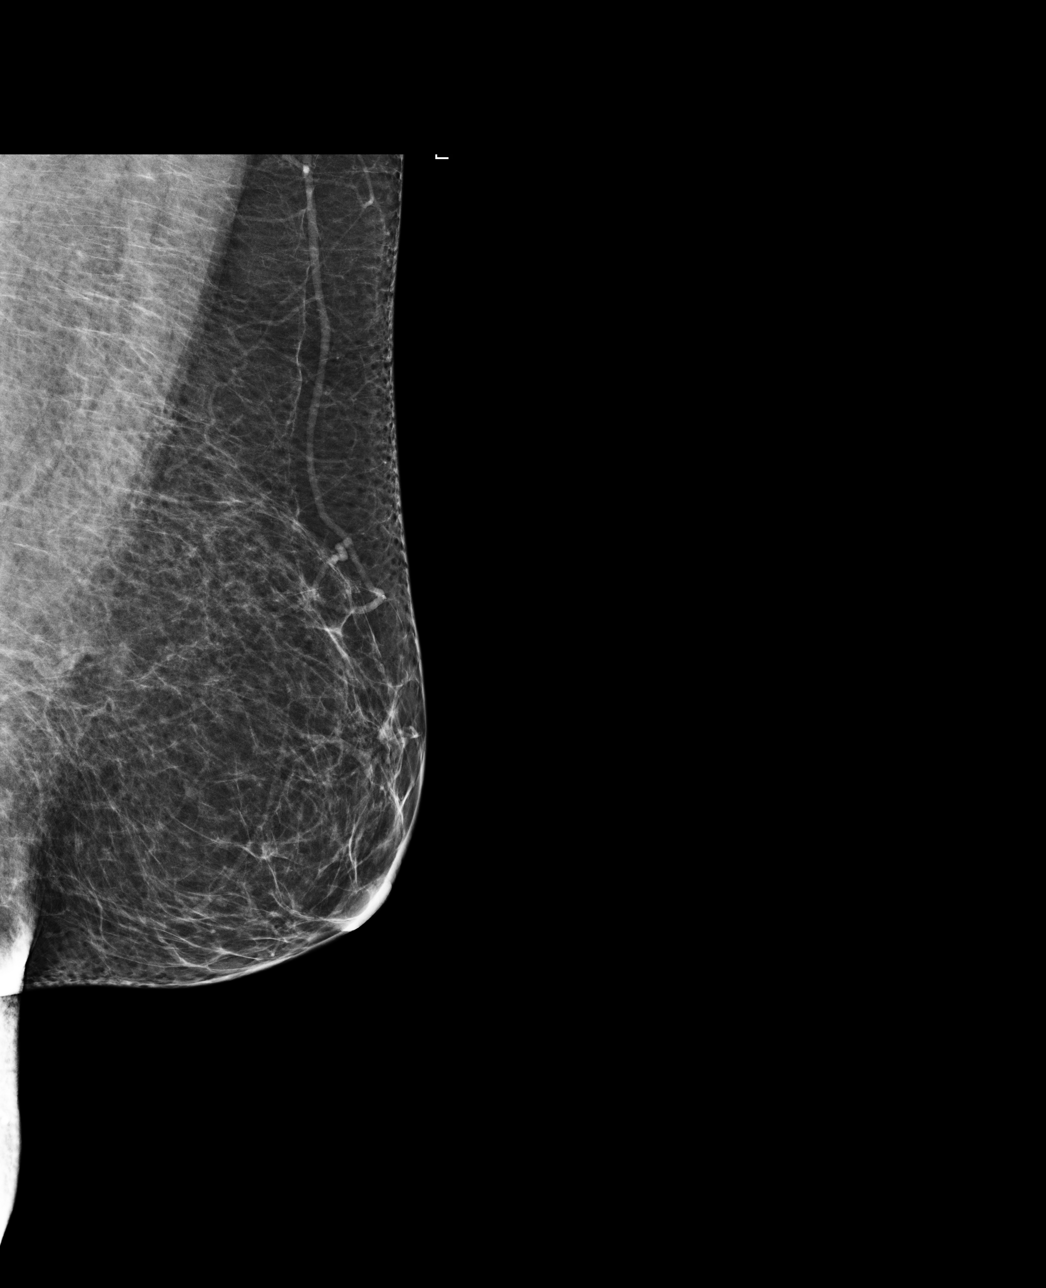

[R CC]
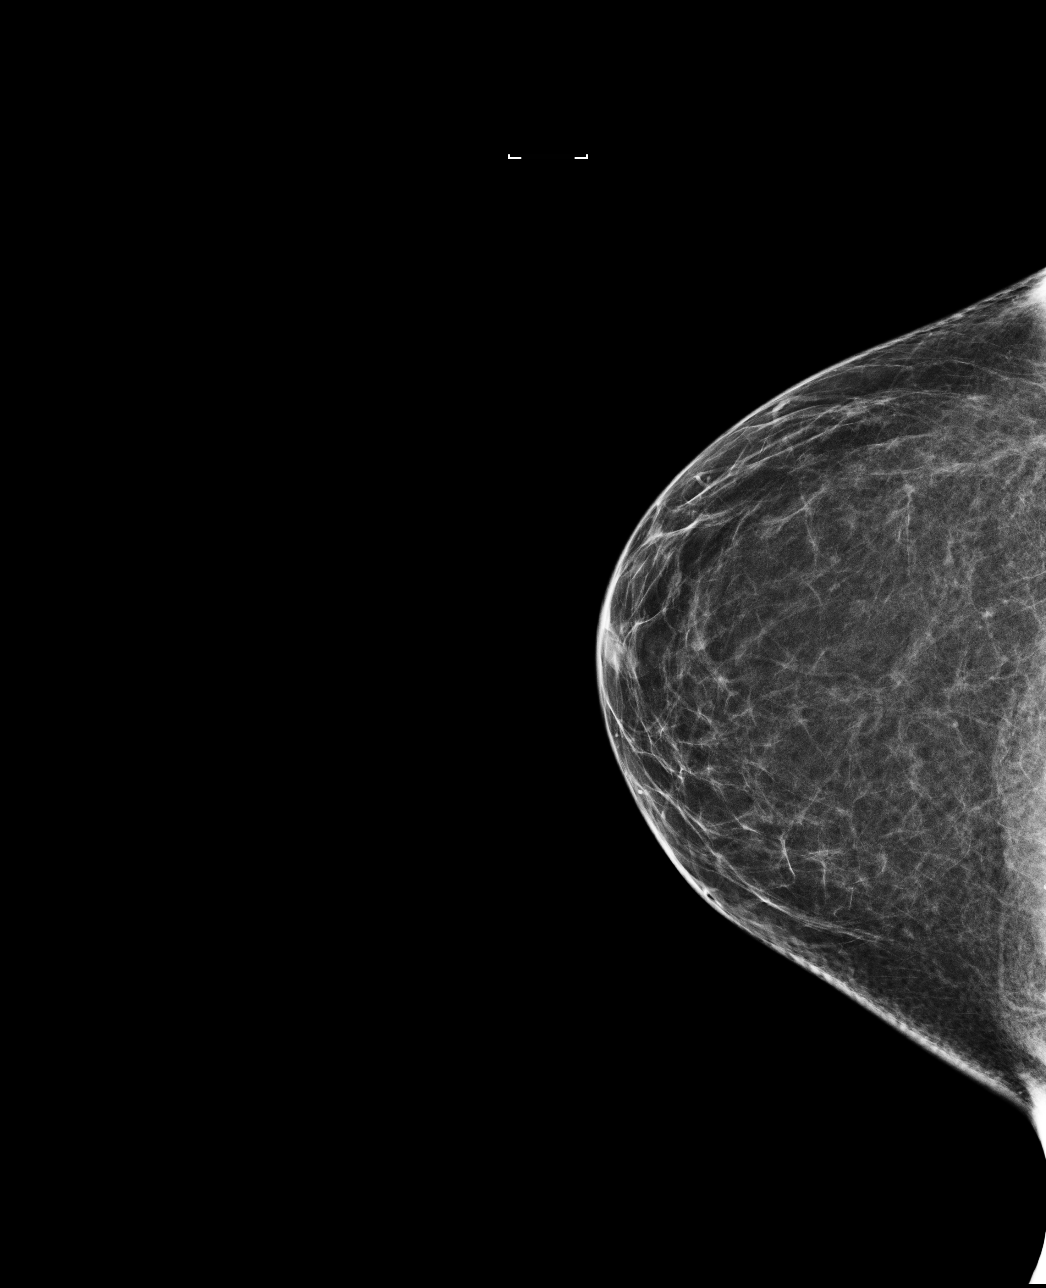

[L CC]
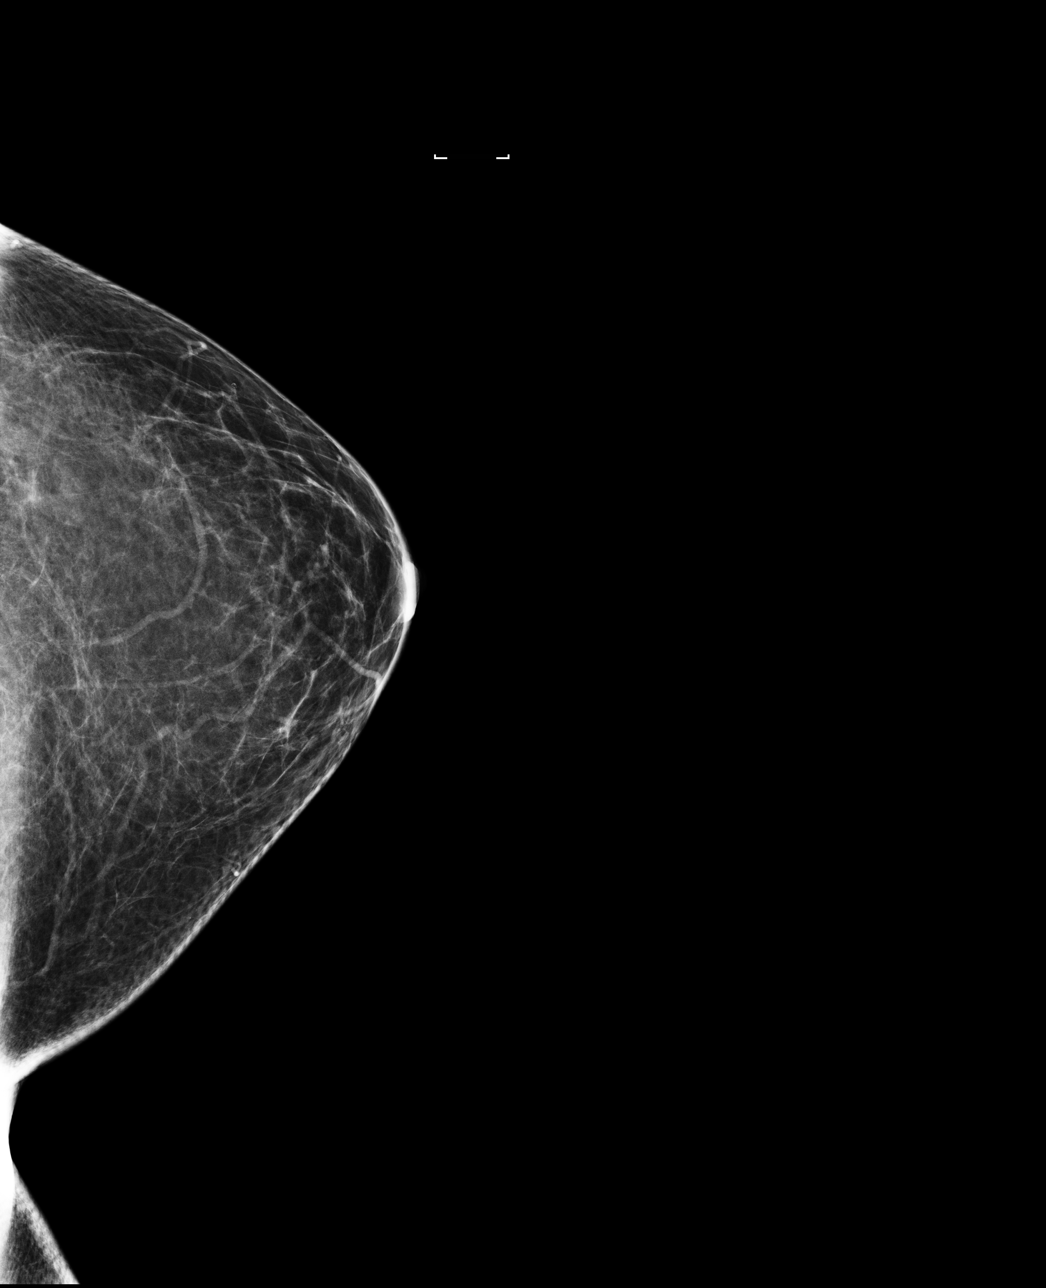

[4 of 4 positions shown; findings below may reference images not displayed]

ACR Breast Density Category b: There are scattered areas of
fibroglandular density.
FINDINGS: There are no findings suspicious for malignancy. Images were
processed with CAD.
IMPRESSION: No mammographic evidence of malignancy. A result letter of this
screening mammogram will be mailed directly to the patient.

RECOMMENDATION:
Screening mammogram in one year. (Code:AS-G-LCT)

BI-RADS CATEGORY  1: Negative.

## 2018-03-01 DIAGNOSIS — I252 Old myocardial infarction: Secondary | ICD-10-CM | POA: Diagnosis not present

## 2018-03-01 DIAGNOSIS — I2510 Atherosclerotic heart disease of native coronary artery without angina pectoris: Secondary | ICD-10-CM | POA: Diagnosis not present

## 2018-03-01 DIAGNOSIS — E782 Mixed hyperlipidemia: Secondary | ICD-10-CM | POA: Diagnosis not present

## 2018-08-12 DIAGNOSIS — Z23 Encounter for immunization: Secondary | ICD-10-CM | POA: Diagnosis not present

## 2018-08-31 DIAGNOSIS — I2510 Atherosclerotic heart disease of native coronary artery without angina pectoris: Secondary | ICD-10-CM | POA: Diagnosis not present

## 2018-08-31 DIAGNOSIS — E782 Mixed hyperlipidemia: Secondary | ICD-10-CM | POA: Diagnosis not present

## 2018-08-31 DIAGNOSIS — I252 Old myocardial infarction: Secondary | ICD-10-CM | POA: Diagnosis not present

## 2019-02-28 ENCOUNTER — Telehealth: Payer: Self-pay | Admitting: Podiatry

## 2019-02-28 NOTE — Telephone Encounter (Signed)
Requested an Op Note from Dr Al Corpus surgery in 2014.  Fax to [phone] Attn: Dorene Grebe

## 2019-03-05 NOTE — Telephone Encounter (Signed)
Shelly, please advise. Thank you. °

## 2019-03-21 ENCOUNTER — Telehealth: Payer: Self-pay | Admitting: Podiatry

## 2019-03-21 NOTE — Telephone Encounter (Signed)
[facility] needs Op Notes from previous surgery. Please Fax. Sx is next week   Ask for [employer] [phone]

## 2019-06-29 ENCOUNTER — Other Ambulatory Visit: Payer: Self-pay | Admitting: Physician Assistant

## 2019-06-29 DIAGNOSIS — Z1231 Encounter for screening mammogram for malignant neoplasm of breast: Secondary | ICD-10-CM

## 2019-08-27 ENCOUNTER — Ambulatory Visit
Admission: RE | Admit: 2019-08-27 | Discharge: 2019-08-27 | Disposition: A | Discharge status: Home / Self Care | Payer: 59 | Source: Ambulatory Visit | Attending: Physician Assistant | Admitting: Physician Assistant

## 2019-08-27 ENCOUNTER — Other Ambulatory Visit: Payer: Self-pay

## 2019-08-27 DIAGNOSIS — Z1231 Encounter for screening mammogram for malignant neoplasm of breast: Secondary | ICD-10-CM

## 2020-10-29 ENCOUNTER — Other Ambulatory Visit: Payer: 59

## 2020-10-29 DIAGNOSIS — Z20822 Contact with and (suspected) exposure to covid-19: Secondary | ICD-10-CM

## 2020-10-30 LAB — SARS-COV-2, NAA 2 DAY TAT

## 2020-10-30 LAB — NOVEL CORONAVIRUS, NAA: SARS-CoV-2, NAA: NOT DETECTED

## 2020-12-01 DIAGNOSIS — R1032 Left lower quadrant pain: Secondary | ICD-10-CM | POA: Diagnosis not present

## 2020-12-29 DIAGNOSIS — I2510 Atherosclerotic heart disease of native coronary artery without angina pectoris: Secondary | ICD-10-CM | POA: Diagnosis not present

## 2020-12-29 DIAGNOSIS — E782 Mixed hyperlipidemia: Secondary | ICD-10-CM | POA: Diagnosis not present

## 2020-12-29 DIAGNOSIS — I252 Old myocardial infarction: Secondary | ICD-10-CM | POA: Diagnosis not present

## 2021-04-19 DIAGNOSIS — R1032 Left lower quadrant pain: Secondary | ICD-10-CM | POA: Diagnosis not present

## 2021-07-21 DIAGNOSIS — L603 Nail dystrophy: Secondary | ICD-10-CM | POA: Diagnosis not present

## 2021-07-21 DIAGNOSIS — Z978 Presence of other specified devices: Secondary | ICD-10-CM | POA: Diagnosis not present

## 2021-08-05 DIAGNOSIS — L603 Nail dystrophy: Secondary | ICD-10-CM | POA: Diagnosis not present

## 2021-08-05 DIAGNOSIS — Z472 Encounter for removal of internal fixation device: Secondary | ICD-10-CM | POA: Diagnosis not present

## 2021-11-26 DIAGNOSIS — Z0000 Encounter for general adult medical examination without abnormal findings: Secondary | ICD-10-CM | POA: Diagnosis not present

## 2021-11-26 DIAGNOSIS — I10 Essential (primary) hypertension: Secondary | ICD-10-CM | POA: Diagnosis not present

## 2021-11-26 DIAGNOSIS — Z124 Encounter for screening for malignant neoplasm of cervix: Secondary | ICD-10-CM | POA: Diagnosis not present

## 2021-11-26 DIAGNOSIS — E782 Mixed hyperlipidemia: Secondary | ICD-10-CM | POA: Diagnosis not present

## 2021-11-26 DIAGNOSIS — I25119 Atherosclerotic heart disease of native coronary artery with unspecified angina pectoris: Secondary | ICD-10-CM | POA: Diagnosis not present

## 2021-11-26 DIAGNOSIS — Z1231 Encounter for screening mammogram for malignant neoplasm of breast: Secondary | ICD-10-CM | POA: Diagnosis not present

## 2021-11-26 DIAGNOSIS — Z1211 Encounter for screening for malignant neoplasm of colon: Secondary | ICD-10-CM | POA: Diagnosis not present

## 2022-01-04 DIAGNOSIS — Z1231 Encounter for screening mammogram for malignant neoplasm of breast: Secondary | ICD-10-CM | POA: Diagnosis not present

## 2022-05-06 DIAGNOSIS — L821 Other seborrheic keratosis: Secondary | ICD-10-CM | POA: Diagnosis not present

## 2022-05-06 DIAGNOSIS — L738 Other specified follicular disorders: Secondary | ICD-10-CM | POA: Diagnosis not present

## 2022-09-29 DIAGNOSIS — Z01818 Encounter for other preprocedural examination: Secondary | ICD-10-CM | POA: Diagnosis not present

## 2022-11-26 DIAGNOSIS — Z0000 Encounter for general adult medical examination without abnormal findings: Secondary | ICD-10-CM | POA: Diagnosis not present

## 2022-11-26 DIAGNOSIS — Z1231 Encounter for screening mammogram for malignant neoplasm of breast: Secondary | ICD-10-CM | POA: Diagnosis not present

## 2022-11-26 DIAGNOSIS — I252 Old myocardial infarction: Secondary | ICD-10-CM | POA: Diagnosis not present

## 2022-11-26 DIAGNOSIS — Z124 Encounter for screening for malignant neoplasm of cervix: Secondary | ICD-10-CM | POA: Diagnosis not present

## 2022-11-26 DIAGNOSIS — E782 Mixed hyperlipidemia: Secondary | ICD-10-CM | POA: Diagnosis not present

## 2022-11-26 DIAGNOSIS — I25119 Atherosclerotic heart disease of native coronary artery with unspecified angina pectoris: Secondary | ICD-10-CM | POA: Diagnosis not present

## 2022-11-26 DIAGNOSIS — I10 Essential (primary) hypertension: Secondary | ICD-10-CM | POA: Diagnosis not present

## 2022-11-26 DIAGNOSIS — Z1211 Encounter for screening for malignant neoplasm of colon: Secondary | ICD-10-CM | POA: Diagnosis not present

## 2022-12-30 DIAGNOSIS — K648 Other hemorrhoids: Secondary | ICD-10-CM | POA: Diagnosis not present

## 2022-12-30 DIAGNOSIS — D125 Benign neoplasm of sigmoid colon: Secondary | ICD-10-CM | POA: Diagnosis not present

## 2022-12-30 DIAGNOSIS — K635 Polyp of colon: Secondary | ICD-10-CM | POA: Diagnosis not present

## 2022-12-30 DIAGNOSIS — K5730 Diverticulosis of large intestine without perforation or abscess without bleeding: Secondary | ICD-10-CM | POA: Diagnosis not present

## 2022-12-30 DIAGNOSIS — Z1211 Encounter for screening for malignant neoplasm of colon: Secondary | ICD-10-CM | POA: Diagnosis not present

## 2022-12-30 DIAGNOSIS — K6389 Other specified diseases of intestine: Secondary | ICD-10-CM | POA: Diagnosis not present

## 2023-05-27 DIAGNOSIS — Z79899 Other long term (current) drug therapy: Secondary | ICD-10-CM | POA: Diagnosis not present

## 2023-05-27 DIAGNOSIS — Z0000 Encounter for general adult medical examination without abnormal findings: Secondary | ICD-10-CM | POA: Diagnosis not present

## 2023-05-27 DIAGNOSIS — E782 Mixed hyperlipidemia: Secondary | ICD-10-CM | POA: Diagnosis not present

## 2023-05-27 DIAGNOSIS — Q740 Other congenital malformations of upper limb(s), including shoulder girdle: Secondary | ICD-10-CM | POA: Diagnosis not present

## 2023-05-27 DIAGNOSIS — Z01419 Encounter for gynecological examination (general) (routine) without abnormal findings: Secondary | ICD-10-CM | POA: Diagnosis not present

## 2023-05-30 ENCOUNTER — Other Ambulatory Visit (HOSPITAL_BASED_OUTPATIENT_CLINIC_OR_DEPARTMENT_OTHER): Payer: Self-pay | Admitting: Family Medicine

## 2023-05-30 DIAGNOSIS — M89319 Hypertrophy of bone, unspecified shoulder: Secondary | ICD-10-CM

## 2023-06-06 ENCOUNTER — Ambulatory Visit (HOSPITAL_BASED_OUTPATIENT_CLINIC_OR_DEPARTMENT_OTHER)
Admission: RE | Admit: 2023-06-06 | Discharge: 2023-06-06 | Disposition: A | Discharge status: Home / Self Care | Payer: BC Managed Care – PPO | Source: Ambulatory Visit | Attending: Family Medicine | Admitting: Family Medicine

## 2023-06-06 DIAGNOSIS — M89319 Hypertrophy of bone, unspecified shoulder: Secondary | ICD-10-CM | POA: Insufficient documentation

## 2023-06-06 DIAGNOSIS — M25511 Pain in right shoulder: Secondary | ICD-10-CM | POA: Diagnosis not present

## 2023-06-06 DIAGNOSIS — M19011 Primary osteoarthritis, right shoulder: Secondary | ICD-10-CM | POA: Diagnosis not present

## 2023-06-06 DIAGNOSIS — M25512 Pain in left shoulder: Secondary | ICD-10-CM | POA: Diagnosis not present

## 2023-06-06 DIAGNOSIS — M19012 Primary osteoarthritis, left shoulder: Secondary | ICD-10-CM | POA: Diagnosis not present

## 2024-03-01 DIAGNOSIS — Z0000 Encounter for general adult medical examination without abnormal findings: Secondary | ICD-10-CM | POA: Diagnosis not present

## 2024-03-01 DIAGNOSIS — E669 Obesity, unspecified: Secondary | ICD-10-CM | POA: Diagnosis not present

## 2024-07-16 ENCOUNTER — Other Ambulatory Visit (HOSPITAL_BASED_OUTPATIENT_CLINIC_OR_DEPARTMENT_OTHER): Payer: Self-pay | Admitting: Physician Assistant

## 2024-07-16 DIAGNOSIS — Z1231 Encounter for screening mammogram for malignant neoplasm of breast: Secondary | ICD-10-CM

## 2024-07-27 ENCOUNTER — Encounter (HOSPITAL_BASED_OUTPATIENT_CLINIC_OR_DEPARTMENT_OTHER): Payer: Self-pay | Admitting: Radiology

## 2024-07-27 ENCOUNTER — Ambulatory Visit (HOSPITAL_BASED_OUTPATIENT_CLINIC_OR_DEPARTMENT_OTHER)
Admission: RE | Admit: 2024-07-27 | Discharge: 2024-07-27 | Disposition: A | Discharge status: Home / Self Care | Source: Ambulatory Visit | Attending: Physician Assistant | Admitting: Physician Assistant

## 2024-07-27 DIAGNOSIS — Z1231 Encounter for screening mammogram for malignant neoplasm of breast: Secondary | ICD-10-CM | POA: Diagnosis not present

## 2024-08-10 DIAGNOSIS — Z6831 Body mass index (BMI) 31.0-31.9, adult: Secondary | ICD-10-CM | POA: Diagnosis not present

## 2024-08-10 DIAGNOSIS — M542 Cervicalgia: Secondary | ICD-10-CM | POA: Diagnosis not present

## 2024-08-10 DIAGNOSIS — Z23 Encounter for immunization: Secondary | ICD-10-CM | POA: Diagnosis not present

## 2024-08-10 DIAGNOSIS — E782 Mixed hyperlipidemia: Secondary | ICD-10-CM | POA: Diagnosis not present

## 2024-08-10 DIAGNOSIS — E66811 Obesity, class 1: Secondary | ICD-10-CM | POA: Diagnosis not present

## 2024-08-10 DIAGNOSIS — Z1330 Encounter for screening examination for mental health and behavioral disorders, unspecified: Secondary | ICD-10-CM | POA: Diagnosis not present

## 2024-09-12 DIAGNOSIS — K5792 Diverticulitis of intestine, part unspecified, without perforation or abscess without bleeding: Secondary | ICD-10-CM | POA: Diagnosis not present

## 2024-09-12 DIAGNOSIS — Z8719 Personal history of other diseases of the digestive system: Secondary | ICD-10-CM | POA: Diagnosis not present
